# Patient Record
Sex: Male | Born: 1980 | Race: White | Hispanic: No | Marital: Single
Health system: Midwestern US, Community
[De-identification: ages and names within clinical notes are randomized; demographics above are authoritative.]

## PROBLEM LIST (undated history)

## (undated) DIAGNOSIS — I10 Essential (primary) hypertension: Secondary | ICD-10-CM

## (undated) DIAGNOSIS — I639 Cerebral infarction, unspecified: Secondary | ICD-10-CM

## (undated) DIAGNOSIS — E7800 Pure hypercholesterolemia, unspecified: Secondary | ICD-10-CM

---

## 2018-04-23 ENCOUNTER — Emergency Department: Admit: 2018-04-23 | Payer: BLUE CROSS/BLUE SHIELD | Primary: Family Medicine

## 2018-04-23 ENCOUNTER — Inpatient Hospital Stay
Admit: 2018-04-23 | Discharge: 2018-04-23 | Disposition: A | Discharge status: Home / Self Care | Payer: BLUE CROSS/BLUE SHIELD | Attending: Emergency Medicine

## 2018-04-23 DIAGNOSIS — J069 Acute upper respiratory infection, unspecified: Secondary | ICD-10-CM

## 2018-04-23 LAB — RAPID INFLUENZA A/B ANTIGENS
Influenza A Ag: NEGATIVE
Influenza B Ag: NEGATIVE

## 2018-04-23 LAB — INFLUENZA A & B AG (RAPID TEST)
Influenza A Ag: NEGATIVE
Influenza B Ag: NEGATIVE

## 2018-04-23 MED ORDER — DOXYCYCLINE HYCLATE 100 MG TAB
100 mg | ORAL_TABLET | Freq: Two times a day (BID) | ORAL | 0 refills | Status: AC
Start: 2018-04-23 — End: 2018-05-03

## 2018-04-23 MED ORDER — FLUTICASONE 50 MCG/ACTUATION NASAL SPRAY, SUSP
50 mcg/actuation | Freq: Every day | NASAL | 0 refills | Status: AC
Start: 2018-04-23 — End: ?

## 2018-04-23 NOTE — ED Provider Notes (Signed)
Here with a cough for about 1/2 weeks.  He did no point has been productive.  He is unaware of any fever other than he felt as though he might of had a low-grade temperature this morning.  No history of underlying pulmonary/respiratory issues but he does have a history of significant hypertension and having had a stroke within the last year.  Stroke left him with some moderate to mild left-sided deficit.  He is also in a sober living environment that he has been placed here from the facility he was in [place].  He states he missed his work schedule today needs a note    The history is provided by the patient.   Cough   Pertinent negatives include no chills.        History reviewed. No pertinent past medical history.    History reviewed. No pertinent surgical history.      History reviewed. No pertinent family history.    Social History     Socioeconomic History   ??? Marital status: SINGLE     Spouse name: Not on file   ??? Number of children: Not on file   ??? Years of education: Not on file   ??? Highest education level: Not on file   Occupational History   ??? Not on file   Social Needs   ??? Financial resource strain: Not on file   ??? Food insecurity:     Worry: Not on file     Inability: Not on file   ??? Transportation needs:     Medical: Not on file     Non-medical: Not on file   Tobacco Use   ??? Smoking status: Never Smoker   ??? Smokeless tobacco: Never Used   Substance and Sexual Activity   ??? Alcohol use: Never     Frequency: Never   ??? Drug use: Never   ??? Sexual activity: Not on file   Lifestyle   ??? Physical activity:     Days per week: Not on file     Minutes per session: Not on file   ??? Stress: Not on file   Relationships   ??? Social connections:     Talks on phone: Not on file     Gets together: Not on file     Attends religious service: Not on file     Active member of club or organization: Not on file     Attends meetings of clubs or organizations: Not on file     Relationship status: Not on file   ??? Intimate  partner violence:     Fear of current or ex partner: Not on file     Emotionally abused: Not on file     Physically abused: Not on file     Forced sexual activity: Not on file   Other Topics Concern   ??? Not on file   Social History Narrative   ??? Not on file         ALLERGIES: Patient has no known allergies.    Review of Systems   Constitutional: Negative for chills and diaphoresis.   Respiratory: Positive for cough. Negative for stridor.    Cardiovascular: Negative.    Gastrointestinal: Negative.    All other systems reviewed and are negative.      Vitals:    04/23/18 1425   BP: 125/74   Pulse: 98   Resp: 16   Temp: 98 ??F (36.7 ??C)   SpO2: 99%  Weight: 61.7 kg (136 lb)   Height: 5\' 4"  (1.626 m)            Physical Exam   Constitutional: He appears well-developed and well-nourished. No distress.   HENT:   Head: Atraumatic.   Right Ear: External ear normal.   Left Ear: External ear normal.   Nose: Nose normal.   Mouth/Throat: Oropharynx is clear and moist. No oropharyngeal exudate.   Benign exam   Eyes: No scleral icterus.   Neck: Neck supple.   Cardiovascular: Normal rate, regular rhythm and intact distal pulses.   Pulmonary/Chest: Effort normal. No stridor. No respiratory distress. He has no wheezes.   Abdominal: Soft.   Musculoskeletal: He exhibits deformity. He exhibits no edema.   Lymphadenopathy:     He has no cervical adenopathy.   Neurological: He is alert.   Skin: Skin is warm and dry.   Psychiatric: Thought content normal.   Nursing note and vitals reviewed.       MDM  Number of Diagnoses or Management Options  Cough:   Upper respiratory tract infection, unspecified type:   Diagnosis management comments: Glenford Peers with cough at present seems viral. Will give Rx he can begin if fever and significant infected sputum. No substance rehab program       Amount and/or Complexity of Data Reviewed  Clinical lab tests: reviewed  Tests in the radiology section of CPT??: reviewed  Independent visualization of images,  tracings, or specimens: yes    Risk of Complications, Morbidity, and/or Mortality  Presenting problems: moderate  Diagnostic procedures: low  Management options: moderate    Patient Progress  Patient progress: stable         Procedures

## 2018-04-23 NOTE — ED Notes (Signed)
Patient co chest congestion and sinus pressure for about one week.  Patient denies taking any otc meds to help with symptoms.  Patient states cough is dry

## 2018-04-23 NOTE — ED Notes (Signed)
 I have reviewed discharge instructions with the patient.  The patient verbalized understanding.    Patient left ED via Discharge Method: ambulatory to Home.    Opportunity for questions and clarification provided.       Patient given 2 scripts.         To continue your aftercare when you leave the hospital, you may receive an automated call from our care team to check in on how you are doing.  This is a free service and part of our promise to provide the best care and service to meet your aftercare needs." If you have questions, or wish to unsubscribe from this service please call [phone].  Thank you for Choosing our [facility] Emergency Department.

## 2018-04-23 NOTE — ED Provider Notes (Signed)
Here with a cough for about 1/2 weeks.  He did no point has been productive.  He is unaware of any fever other than he felt as though he might of had a low-grade temperature this morning.  No history of underlying pulmonary/respiratory issues but he does have a history of significant hypertension and having had a stroke within the last year.  Stroke left him with some moderate to mild left-sided deficit.  He is also in a sober living environment that he has been placed here from the facility he was in [place].  He states he missed his work schedule today needs a note    The history is provided by the patient.   Cough   Pertinent negatives include no chills.        History reviewed. No pertinent past medical history.    History reviewed. No pertinent surgical history.      History reviewed. No pertinent family history.    Social History     Socioeconomic History   ??? Marital status: SINGLE     Spouse name: Not on file   ??? Number of children: Not on file   ??? Years of education: Not on file   ??? Highest education level: Not on file   Occupational History   ??? Not on file   Social Needs   ??? Financial resource strain: Not on file   ??? Food insecurity:     Worry: Not on file     Inability: Not on file   ??? Transportation needs:     Medical: Not on file     Non-medical: Not on file   Tobacco Use   ??? Smoking status: Never Smoker   ??? Smokeless tobacco: Never Used   Substance and Sexual Activity   ??? Alcohol use: Never     Frequency: Never   ??? Drug use: Never   ??? Sexual activity: Not on file   Lifestyle   ??? Physical activity:     Days per week: Not on file     Minutes per session: Not on file   ??? Stress: Not on file   Relationships   ??? Social connections:     Talks on phone: Not on file     Gets together: Not on file     Attends religious service: Not on file     Active member of club or organization: Not on file     Attends meetings of clubs or organizations: Not on file     Relationship status: Not on file    ??? Intimate partner violence:     Fear of current or ex partner: Not on file     Emotionally abused: Not on file     Physically abused: Not on file     Forced sexual activity: Not on file   Other Topics Concern   ??? Not on file   Social History Narrative   ??? Not on file         ALLERGIES: Patient has no known allergies.    Review of Systems   Constitutional: Negative for chills and diaphoresis.   Respiratory: Positive for cough. Negative for stridor.    Cardiovascular: Negative.    Gastrointestinal: Negative.    All other systems reviewed and are negative.      Vitals:    04/23/18 1425   BP: 125/74   Pulse: 98   Resp: 16   Temp: 98 ??F (36.7 ??C)   SpO2: 99%  Weight: 61.7 kg (136 lb)   Height: 5\' 4"  (1.626 m)            Physical Exam   Constitutional: He appears well-developed and well-nourished. No distress.   HENT:   Head: Atraumatic.   Right Ear: External ear normal.   Left Ear: External ear normal.   Nose: Nose normal.   Mouth/Throat: Oropharynx is clear and moist. No oropharyngeal exudate.   Benign exam   Eyes: No scleral icterus.   Neck: Neck supple.   Cardiovascular: Normal rate, regular rhythm and intact distal pulses.   Pulmonary/Chest: Effort normal. No stridor. No respiratory distress. He has no wheezes.   Abdominal: Soft.   Musculoskeletal: He exhibits deformity. He exhibits no edema.   Lymphadenopathy:     He has no cervical adenopathy.   Neurological: He is alert.   Skin: Skin is warm and dry.   Psychiatric: Thought content normal.   Nursing note and vitals reviewed.       MDM  Number of Diagnoses or Management Options  Cough:   Upper respiratory tract infection, unspecified type:   Diagnosis management comments: Glenford Peers with cough at present seems viral. Will give Rx he can begin if fever and significant infected sputum. No substance rehab program       Amount and/or Complexity of Data Reviewed  Clinical lab tests: reviewed  Tests in the radiology section of CPT??: reviewed   Independent visualization of images, tracings, or specimens: yes    Risk of Complications, Morbidity, and/or Mortality  Presenting problems: moderate  Diagnostic procedures: low  Management options: moderate    Patient Progress  Patient progress: stable         Procedures

## 2018-04-23 NOTE — ED Triage Notes (Signed)
Patient co chest congestion and sinus pressure for about one week.  Patient denies taking any otc meds to help with symptoms.  Patient states cough is dry

## 2018-04-23 NOTE — ED Notes (Signed)
I have reviewed discharge instructions with the patient.  The patient verbalized understanding.    Patient left ED via Discharge Method: ambulatory to Home.    Opportunity for questions and clarification provided.       Patient given 2 scripts.         To continue your aftercare when you leave the hospital, you may receive an automated call from our care team to check in on how you are doing.  This is a free service and part of our promise to provide the best care and service to meet your aftercare needs.??? If you have questions, or wish to unsubscribe from this service please call [phone].  Thank you for Choosing our [place] Emergency Department.

## 2018-06-25 ENCOUNTER — Emergency Department (HOSPITAL_COMMUNITY): Payer: BLUE CROSS/BLUE SHIELD

## 2018-06-25 ENCOUNTER — Other Ambulatory Visit: Payer: Self-pay

## 2018-06-25 ENCOUNTER — Encounter (HOSPITAL_COMMUNITY): Payer: Self-pay | Admitting: Emergency Medicine

## 2018-06-25 ENCOUNTER — Emergency Department (HOSPITAL_COMMUNITY)
Admission: EM | Admit: 2018-06-25 | Discharge: 2018-06-25 | Disposition: A | Discharge status: Home / Self Care | Payer: BLUE CROSS/BLUE SHIELD | Attending: Emergency Medicine | Admitting: Emergency Medicine

## 2018-06-25 DIAGNOSIS — R2681 Unsteadiness on feet: Secondary | ICD-10-CM | POA: Diagnosis not present

## 2018-06-25 DIAGNOSIS — I10 Essential (primary) hypertension: Secondary | ICD-10-CM | POA: Insufficient documentation

## 2018-06-25 DIAGNOSIS — R200 Anesthesia of skin: Secondary | ICD-10-CM | POA: Diagnosis not present

## 2018-06-25 DIAGNOSIS — R29810 Facial weakness: Secondary | ICD-10-CM | POA: Diagnosis not present

## 2018-06-25 DIAGNOSIS — Z8673 Personal history of transient ischemic attack (TIA), and cerebral infarction without residual deficits: Secondary | ICD-10-CM | POA: Diagnosis not present

## 2018-06-25 DIAGNOSIS — R531 Weakness: Secondary | ICD-10-CM | POA: Diagnosis not present

## 2018-06-25 DIAGNOSIS — Z7901 Long term (current) use of anticoagulants: Secondary | ICD-10-CM | POA: Insufficient documentation

## 2018-06-25 DIAGNOSIS — Z79899 Other long term (current) drug therapy: Secondary | ICD-10-CM | POA: Insufficient documentation

## 2018-06-25 HISTORY — DX: Pure hypercholesterolemia, unspecified: E78.00

## 2018-06-25 HISTORY — DX: Essential (primary) hypertension: I10

## 2018-06-25 HISTORY — DX: Cerebral infarction, unspecified: I63.9

## 2018-06-25 LAB — COMPREHENSIVE METABOLIC PANEL WITH GFR
ALT: 26 U/L (ref 0–44)
AST: 22 U/L (ref 15–41)
Albumin: 3.8 g/dL (ref 3.5–5.0)
Alkaline Phosphatase: 60 U/L (ref 38–126)
Anion gap: 11 (ref 5–15)
BUN: 12 mg/dL (ref 6–20)
CO2: 23 mmol/L (ref 22–32)
Calcium: 9.5 mg/dL (ref 8.9–10.3)
Chloride: 105 mmol/L (ref 98–111)
Creatinine, Ser: 1.05 mg/dL (ref 0.61–1.24)
GFR calc Af Amer: >60 mL/min
GFR calc non Af Amer: >60 mL/min
Glucose, Bld: 106 mg/dL — ABNORMAL HIGH (ref 70–99)
Potassium: 3.9 mmol/L (ref 3.5–5.1)
Sodium: 139 mmol/L (ref 135–145)
Total Bilirubin: 0.7 mg/dL (ref 0.3–1.2)
Total Protein: 6.4 g/dL — ABNORMAL LOW (ref 6.5–8.1)

## 2018-06-25 LAB — DIFFERENTIAL
Abs Immature Granulocytes: 0.01 10*3/uL (ref 0.00–0.07)
Basophils Absolute: 0 10*3/uL (ref 0.0–0.1)
Basophils Relative: 0 %
Eosinophils Absolute: 0.1 10*3/uL (ref 0.0–0.5)
Eosinophils Relative: 2 %
Immature Granulocytes: 0 %
LYMPHS PCT: 23 %
Lymphs Abs: 1.7 10*3/uL (ref 0.7–4.0)
Monocytes Absolute: 0.6 10*3/uL (ref 0.1–1.0)
Monocytes Relative: 8 %
NEUTROS ABS: 4.9 10*3/uL (ref 1.7–7.7)
Neutrophils Relative %: 67 %

## 2018-06-25 LAB — PROTIME-INR
INR: 0.98
Prothrombin Time: 12.9 seconds (ref 11.4–15.2)

## 2018-06-25 LAB — CBC
HEMATOCRIT: 42.4 % (ref 39.0–52.0)
Hemoglobin: 13.6 g/dL (ref 13.0–17.0)
MCH: 28.1 pg (ref 26.0–34.0)
MCHC: 32.1 g/dL (ref 30.0–36.0)
MCV: 87.6 fL (ref 80.0–100.0)
Platelets: 292 10*3/uL (ref 150–400)
RBC: 4.84 MIL/uL (ref 4.22–5.81)
RDW: 14.1 % (ref 11.5–15.5)
WBC: 7.3 10*3/uL (ref 4.0–10.5)
nRBC: 0 % (ref 0.0–0.2)

## 2018-06-25 LAB — RAPID URINE DRUG SCREEN, HOSP PERFORMED
AMPHETAMINES: NOT DETECTED
BENZODIAZEPINES: NOT DETECTED
Barbiturates: NOT DETECTED
Cocaine: NOT DETECTED
Opiates: NOT DETECTED
Tetrahydrocannabinol: NOT DETECTED

## 2018-06-25 LAB — I-STAT CHEM 8, ED
BUN: 14 mg/dL (ref 6–20)
Calcium, Ion: 1.07 mmol/L — ABNORMAL LOW (ref 1.15–1.40)
Chloride: 105 mmol/L (ref 98–111)
Creatinine, Ser: 1 mg/dL (ref 0.61–1.24)
Glucose, Bld: 102 mg/dL — ABNORMAL HIGH (ref 70–99)
HCT: 42 % (ref 39.0–52.0)
Hemoglobin: 14.3 g/dL (ref 13.0–17.0)
Potassium: 3.9 mmol/L (ref 3.5–5.1)
Sodium: 139 mmol/L (ref 135–145)
TCO2: 26 mmol/L (ref 22–32)

## 2018-06-25 LAB — APTT: aPTT: 31 seconds (ref 24–36)

## 2018-06-25 LAB — I-STAT TROPONIN, ED: Troponin i, poc: 0 ng/mL (ref 0.00–0.08)

## 2018-06-25 LAB — CBG MONITORING, ED: Glucose-Capillary: 97 mg/dL (ref 70–99)

## 2018-06-25 MED ORDER — LORAZEPAM 1 MG PO TABS
1.0000 mg | ORAL_TABLET | Freq: Once | ORAL | Status: AC
  Administered 2018-06-25: 1 mg via ORAL
  Filled 2018-06-25: qty 1

## 2018-06-25 MED ORDER — IOPAMIDOL (ISOVUE-370) INJECTION 76%
100.0000 mL | Freq: Once | INTRAVENOUS | Status: AC | PRN
  Administered 2018-06-25: 100 mL via INTRAVENOUS

## 2018-06-25 NOTE — Consult Note (Addendum)
Neurology Consultation  Reason for Consult: Code stroke Referring Physician: Fredderick PhenixBelfi  CC: Code stroke  History is obtained from: Patient  HPI: William Morgan is a 37 y.o. male history of possible TIA, anxiety and hypertension.  Patient was with his friends at a apartment complex today when apparently he has sudden facial droop on the left to which reminded me of his previous possible TIA.  He became very anxious and EMS was called.  When EMS arrived his systolic blood pressure was in the 200s but however decreased as they got closer to the hospital.  Arriving at the hospital he was very anxious but had no localizing or lateralizing symptoms.  Heart rate was in the low 100s, blood pressure was 170/74 and CBG was 114.  Patient was able to follow commands and answer questions.  [place] not from [place] but visiting at this time.  CT of head and CTA of head and neck were obtained showing no abnormalities.   LKW: 1400 hrs. on 06/25/2018 tpa given?: no, NIH stroke score of 0 Premorbid modified Rankin scale (mRS): 0  ROS: A 14 point ROS was performed and is negative except as noted in the HPI.  Past Medical History:  Diagnosis Date  . Elevated cholesterol   . Hypertension   . Stroke [facility](HCC)     Family History  Problem Relation Age of Onset  . Hypertension Mother   . Hypertension Father      Social History:   reports that he has never smoked. He has never used smokeless tobacco. He reports previous alcohol use. He reports that he does not use drugs.  Medications No current facility-administered medications for this encounter.  No current outpatient medications on file.   Exam: Current vital signs: BP (!) 134/103 (BP Location: Right Arm)   Pulse (!) 108   Temp 99.1 F (37.3 C) (Oral)   Resp (!) 21   Ht 5\' 4"  (1.626 m)   Wt 64.7 kg   SpO2 99%   BMI 24.48 kg/m  Vital signs in last 24 hours: Temp:  [99.1 F (37.3 C)] 99.1 F (37.3 C) (12/23 1709) Pulse Rate:   [108] 108 (12/23 1709) Resp:  [21] 21 (12/23 1709) BP: (134)/(103) 134/103 (12/23 1709) SpO2:  [98 %-99 %] 99 % (12/23 1709) Weight:  [64.7 kg] 64.7 kg (12/23 1710)  Physical Exam  Constitutional: Appears well-developed and well-nourished.  Psych: Affect appropriate to situation Eyes: No scleral injection HENT: No OP obstrucion Head: Normocephalic.  Cardiovascular: Normal rate and regular rhythm.  Respiratory: Effort normal, non-labored breathing GI: Soft.  No distension. There is no tenderness.  Skin: WDI  Neuro: Mental Status: Patient is awake, alert, oriented to person, place, month, year, and situation. Patient is able to give a clear and coherent history. No signs of aphasia or neglect Cranial Nerves: II: Visual Fields are full. Pupils are equal, round, and reactive to light.   III,IV, VI: EOMI without ptosis or diploplia.  V: Facial sensation is symmetric to temperature VII: Facial movement is symmetric.  VIII: hearing is intact to voice X: Uvula elevates symmetrically XI: Shoulder shrug is symmetric. XII: tongue is midline without atrophy or fasciculations.  Motor: Tone is normal. Bulk is normal. 5/5 strength was present in all four extremities.  Sensory: Sensation is symmetric to light touch and temperature in the arms and legs. Deep Tendon Reflexes: 2+ and symmetric in the biceps and patellae.  Plantars: Toes are downgoing bilaterally.  Cerebellar: FNF and HKS are intact  bilaterally  Labs I have reviewed labs in epic and the results pertinent to this consultation are:   CBC    Component Value Date/Time   WBC 7.3 06/25/2018 1642   RBC 4.84 06/25/2018 1642   HGB 14.3 06/25/2018 1647   HCT 42.0 06/25/2018 1647   PLT 292 06/25/2018 1642   MCV 87.6 06/25/2018 1642   MCH 28.1 06/25/2018 1642   MCHC 32.1 06/25/2018 1642   RDW 14.1 06/25/2018 1642   LYMPHSABS 1.7 06/25/2018 1642   MONOABS 0.6 06/25/2018 1642   EOSABS 0.1 06/25/2018 1642   BASOSABS 0.0  06/25/2018 1642    CMP     Component Value Date/Time   NA 139 06/25/2018 1647   K 3.9 06/25/2018 1647   CL 105 06/25/2018 1647   CO2 23 06/25/2018 1642   GLUCOSE 102 (H) 06/25/2018 1647   BUN 14 06/25/2018 1647   CREATININE 1.00 06/25/2018 1647   CALCIUM 9.5 06/25/2018 1642   PROT 6.4 (L) 06/25/2018 1642   ALBUMIN 3.8 06/25/2018 1642   AST 22 06/25/2018 1642   ALT 26 06/25/2018 1642   ALKPHOS 60 06/25/2018 1642   BILITOT 0.7 06/25/2018 1642   GFRNONAA >60 06/25/2018 1642   GFRAA >60 06/25/2018 1642    Lipid Panel  No results found for: CHOL, TRIG, HDL, CHOLHDL, VLDL, LDLCALC, LDLDIRECT   Imaging I have reviewed the images obtained:  CT-scan of the brain- normal noncontrast CT appearance of the brain  CTA of head neck- showed no large vessel occlusion or aneurysm.  MRI examination of the brain  Felicie MornDavid Smith PA-C [facility] [phone]  M-F  (9:00 am- 5:00 PM)  06/25/2018, 5:19 PM     Assessment:  This is a 37 year old male presenting to the hospital as a code stroke.  Patient has no localizing lateralizing abnormalities.  At this time I truly believe that patient is having an anxiety attack however we will obtain a MRI to fully rule out any form of stroke.  We will also obtain UDS to evaluate for possibility of illicit drug use.   Recommendations: -UDS -MRI brain - If these are negative no further work-up and patient may go home    NEUROHOSPITALIST ADDENDUM Performed a face to face diagnostic evaluation.   I have reviewed the contents of history and physical exam as documented by PA/ARNP/Resident and agree with above documentation.  I have discussed and formulated the above plan as documented. Edits to the note have been made as needed.  Patient presents with sudden onset left-sided weakness and left facial droop.  Facial droop disappears as patient speaks, asked to smile.. Symptoms were too mild to administer TPA.  Low suspicion for stroke,  suspect conversion disorder versus complicated migraine. Recommend MRI brain.   MRI brain shows no acute strok-  patient does have a old lacunar infarct from his history of hypertension.  Patient should continue antiplatelet therapy as well as his blood pressure medications.   Can follow-up with neurology as an outpatient   Sushanth Aroor MD [facility] [phone]   If 7pm to 7am, please call on call as listed on AMION.

## 2018-06-25 NOTE — ED Notes (Signed)
Patient transported to MRI 

## 2018-06-25 NOTE — Code Documentation (Signed)
37[facility] at [facility]38. EMS reports sudden onset left facial droop and left sided weakness at 1600. EMS reports patient initially hypertensive with SBP of 200. Of note, patient recently moved here from [place]. Patient reports h/o stroke one year ago with mild left sided residual deficits. Patient reports taking an antihypertensive as well as ASA and Plavix. Stroke team at the bedside on patient arrival. Labs drawn and patient cleared for CT by Dr. Fredderick PhenixBelfi. Patient to CT with team. CT followed by CTA completed. NIHSS 2, see documentation for details and code stroke times. Patient with slight left arm drift and subjective decreased sensation to left face, arm and leg on exam. Dr. Laurence SlateAroor at the bedside. No acute stroke treatment at this time. Patient remains in the tPA window until 2030 should symptoms worsen. Bedside handoff with ED RN Merry ProudBrandi.

## 2018-06-25 NOTE — ED Triage Notes (Signed)
Patient presents to the ED by EMS with c/o stroke like symptoms. Pt with LKW 16:30 reports hanging at with buddies when they noticed a "change" pt reports hx of CVA x1 year ago with left side residual. Left sided facial drop and weakness in the left leg.  Vitals   200 /100 170/74 98 pulse cbg 114

## 2018-06-25 NOTE — ED Provider Notes (Signed)
[facility] EMERGENCY DEPARTMENT Provider Note   CSN: [id number] Arrival date & time: 06/25/18  1641   An emergency department physician performed an initial assessment on this suspected stroke patient at 1640(Jacyln Carmer).  History   Chief Complaint Chief Complaint  Patient presents with  . Code Stroke    HPI Deno Sida is a 37 y.o. male.  Patient is a 37 year old male who presents as a code stroke.  He states around 430 this noon he noted some drooping on the left side of his face with weakness in his left side.  This includes left arm and left leg.  He said he was having a hard time walking.  He denies any vision changes.  No recent head trauma.  No speech deficits.  He reports that about a year ago he had what was felt to be a stroke although he had a normal CT scan and normal MRI.  He has a history of hypertension and hyperlipidemia.     Past Medical History:  Diagnosis Date  . Elevated cholesterol   . Hypertension   . Stroke [facility])     There are no active problems to display for this patient.   History reviewed. No pertinent surgical history.      Home Medications    Prior to Admission medications   Medication Sig Start Date End Date Taking? Authorizing Provider  amitriptyline (ELAVIL) 25 MG tablet Take 25 mg by mouth daily.   Yes [provider]  amLODipine (NORVASC) 10 MG tablet Take 10 mg by mouth daily.   Yes [provider]  aspirin EC 325 MG tablet Take 325 mg by mouth daily.   Yes [provider]  atorvastatin (LIPITOR) 20 MG tablet Take 20 mg by mouth at bedtime.   Yes [provider]  clopidogrel (PLAVIX) 75 MG tablet Take 75 mg by mouth at bedtime.   Yes [provider]  oxyCODONE-acetaminophen (PERCOCET) 10-325 MG tablet Take 1 tablet by mouth every 4 (four) hours as needed for pain.   Yes [provider]  traZODone (DESYREL) 100 MG tablet Take 100 mg by mouth at bedtime.    Yes [provider]    Family History Family History  Problem Relation Age of Onset  . Hypertension Mother   . Hypertension Father     Social History Social History   Tobacco Use  . Smoking status: Never Smoker  . Smokeless tobacco: Never Used  Substance Use Topics  . Alcohol use: Not Currently    Frequency: Never  . Drug use: Never     Allergies   Toradol [ketorolac tromethamine] and Tramadol   Review of Systems Review of Systems  Constitutional: Negative for chills, diaphoresis, fatigue and fever.  HENT: Negative for congestion, rhinorrhea and sneezing.   Eyes: Negative.   Respiratory: Negative for cough, chest tightness and shortness of breath.   Cardiovascular: Negative for chest pain and leg swelling.  Gastrointestinal: Negative for abdominal pain, blood in stool, diarrhea, nausea and vomiting.  Genitourinary: Negative for difficulty urinating, flank pain, frequency and hematuria.  Musculoskeletal: Negative for arthralgias and back pain.  Skin: Negative for rash.  Neurological: Positive for facial asymmetry, weakness and numbness. Negative for dizziness, speech difficulty and headaches.     Physical Exam Updated Vital Signs BP (!) 170/76   Pulse 91   Temp 99.1 F (37.3 C) (Oral)   Resp 19   Ht 5\' 4"  (1.626 m)   Wt 64.7 kg   SpO2  91%   BMI 24.48 kg/m   Physical Exam Constitutional:      Appearance: He is well-developed.  HENT:     Head: Normocephalic and atraumatic.  Eyes:     Pupils: Pupils are equal, round, and reactive to light.  Neck:     Musculoskeletal: Normal range of motion and neck supple.  Cardiovascular:     Rate and Rhythm: Normal rate and regular rhythm.     Heart sounds: Normal heart sounds.  Pulmonary:     Effort: Pulmonary effort is normal. No respiratory distress.     Breath sounds: Normal breath sounds. No wheezing or rales.  Chest:     Chest wall: No tenderness.  Abdominal:     General: Bowel sounds are normal.       Palpations: Abdomen is soft.     Tenderness: There is no abdominal tenderness. There is no guarding or rebound.  Musculoskeletal: Normal range of motion.  Lymphadenopathy:     Cervical: No cervical adenopathy.  Skin:    General: Skin is warm and dry.     Findings: No rash.  Neurological:     Mental Status: He is alert and oriented to person, place, and time.     Comments: Patient has some slight weakness to his left arm and left leg as compared to the right side.  No pronator drift.  Finger-nose intact, sensation intact light touch in all extremities.  He has some mild left side facial drooping.      ED Treatments / Results  Labs (all labs ordered are listed, but only abnormal results are displayed) Labs Reviewed  COMPREHENSIVE METABOLIC PANEL - Abnormal; Notable for the following components:      Result Value   Glucose, Bld 106 (*)    Total Protein 6.4 (*)    All other components within normal limits  I-STAT CHEM 8, ED - Abnormal; Notable for the following components:   Glucose, Bld 102 (*)    Calcium, Ion 1.07 (*)    All other components within normal limits  PROTIME-INR  APTT  CBC  DIFFERENTIAL  RAPID URINE DRUG SCREEN, HOSP PERFORMED  I-STAT TROPONIN, ED  CBG MONITORING, ED    EKG EKG Interpretation  Date/Time:  Monday June 25 2018 17:09:17 EST Ventricular Rate:  110 PR Interval:    QRS Duration: 86 QT Interval:  321 QTC Calculation: 435 R Axis:   38 Text Interpretation:  Sinus tachycardia Probable left atrial enlargement RSR' in V1 or V2, probably normal variant No old tracing to compare Confirmed by Rolan Bucco [id number]) on 06/25/2018 6:04:52 PM   Radiology Ct Angio Head W Or Wo Contrast  Result Date: 06/25/2018 CLINICAL DATA:  37 year old male code stroke presentation with left side symptoms. EXAM: CT ANGIOGRAPHY HEAD AND NECK TECHNIQUE: Multidetector CT imaging of the head and neck was performed using the standard protocol during bolus  administration of intravenous contrast. Multiplanar CT image reconstructions and MIPs were obtained to evaluate the vascular anatomy. Carotid stenosis measurements (when applicable) are obtained utilizing NASCET criteria, using the distal internal carotid diameter as the denominator. CONTRAST:  ISOVUE-370 IOPAMIDOL (ISOVUE-370) INJECTION 76% COMPARISON:  Noncontrast head CT 1648 hours today. FINDINGS: CTA NECK Skeleton: Prior mandible ORIF. Carious posterior left mandible molar. No acute osseous abnormality identified. Upper chest: Negative. Other neck: Negative. Aortic arch: 3 vessel arch configuration. Age advanced calcified arch and left CCA origin atherosclerosis. Right carotid system: Negative brachiocephalic artery and right CCA origin. Mild soft and calcified plaque  in the right CCA at the larynx without stenosis. Negative right ICA origin and cervical right ICA. Left carotid system: Left CCA origin plaque without stenosis. Intermittent soft and calcified plaque at the medial left CCA without stenosis to the bifurcation. Minimal plaque at the bifurcation. No cervical left ICA stenosis. Vertebral arteries: Proximal right subclavian artery and right vertebral artery origins are normal. The right vertebral is dominant and patent to the skull base without stenosis. Left subclavian artery origin calcified plaque without stenosis. Calcified plaque near the left vertebral artery origin without stenosis. Non dominant but fairly normal size left vertebral artery without stenosis to the skull base. CTA HEAD Posterior circulation: Mild distal vertebral artery irregularity without stenosis might reflect soft plaque. Both PICA origins are patent. Patent vertebrobasilar junction and basilar artery without stenosis. Normal SCA and PCA origins. Posterior communicating arteries are diminutive or absent. Bilateral PCA branches are mildly irregular but otherwise within normal limits. Anterior circulation: Both ICA siphons  are patent. Calcified distal left siphon plaque with only mild stenosis. No right siphon stenosis. Normal ophthalmic artery origins. Posterior communicating arteries are diminutive or absent. Patent carotid termini. Normal MCA and ACA origins. Bilateral ACA branches are mildly irregular. Left MCA M1 segment, bifurcation, and left MCA branches are patent with mild irregularity. Right MCA M1 segment, bifurcation, and right MCA branches are patent, mildly irregular. Venous sinuses: Patent. Anatomic variants: Dominant right vertebral artery. Review of the MIP images confirms the above findings IMPRESSION: 1. Negative for large vessel occlusion. 2. Age advanced atherosclerosis in the head and neck, but hemodynamically significant arterial stenosis identified. 3. These results were communicated to Dr. Laurence SlateAroor at 5:21 pm on 06/25/2018 by text page via the [facility] messaging system. Electronically Signed   By: Odessa FlemingH  Hall M.D.   On: 06/25/2018 17:22   Ct Angio Neck W Or Wo Contrast  Result Date: 06/25/2018 CLINICAL DATA:  37 year old male code stroke presentation with left side symptoms. EXAM: CT ANGIOGRAPHY HEAD AND NECK TECHNIQUE: Multidetector CT imaging of the head and neck was performed using the standard protocol during bolus administration of intravenous contrast. Multiplanar CT image reconstructions and MIPs were obtained to evaluate the vascular anatomy. Carotid stenosis measurements (when applicable) are obtained utilizing NASCET criteria, using the distal internal carotid diameter as the denominator. CONTRAST:  100mL ISOVUE-370 IOPAMIDOL (ISOVUE-370) INJECTION 76% COMPARISON:  Noncontrast head CT 1648 hours today. FINDINGS: CTA NECK Skeleton: Prior mandible ORIF. Carious posterior left mandible molar. No acute osseous abnormality identified. Upper chest: Negative. Other neck: Negative. Aortic arch: 3 vessel arch configuration. Age advanced calcified arch and left CCA origin atherosclerosis. Right carotid system:  Negative brachiocephalic artery and right CCA origin. Mild soft and calcified plaque in the right CCA at the larynx without stenosis. Negative right ICA origin and cervical right ICA. Left carotid system: Left CCA origin plaque without stenosis. Intermittent soft and calcified plaque at the medial left CCA without stenosis to the bifurcation. Minimal plaque at the bifurcation. No cervical left ICA stenosis. Vertebral arteries: Proximal right subclavian artery and right vertebral artery origins are normal. The right vertebral is dominant and patent to the skull base without stenosis. Left subclavian artery origin calcified plaque without stenosis. Calcified plaque near the left vertebral artery origin without stenosis. Non dominant but fairly normal size left vertebral artery without stenosis to the skull base. CTA HEAD Posterior circulation: Mild distal vertebral artery irregularity without stenosis might reflect soft plaque. Both PICA origins are patent. Patent vertebrobasilar junction and basilar artery without  stenosis. Normal SCA and PCA origins. Posterior communicating arteries are diminutive or absent. Bilateral PCA branches are mildly irregular but otherwise within normal limits. Anterior circulation: Both ICA siphons are patent. Calcified distal left siphon plaque with only mild stenosis. No right siphon stenosis. Normal ophthalmic artery origins. Posterior communicating arteries are diminutive or absent. Patent carotid termini. Normal MCA and ACA origins. Bilateral ACA branches are mildly irregular. Left MCA M1 segment, bifurcation, and left MCA branches are patent with mild irregularity. Right MCA M1 segment, bifurcation, and right MCA branches are patent, mildly irregular. Venous sinuses: Patent. Anatomic variants: Dominant right vertebral artery. Review of the MIP images confirms the above findings IMPRESSION: 1. Negative for large vessel occlusion. 2. Age advanced atherosclerosis in the head and neck,  but hemodynamically significant arterial stenosis identified. 3. These results were communicated to Dr. Laurence Slate at 5:21 pm on 06/25/2018 by text page via the [facility] messaging system. Electronically Signed   By: Odessa Fleming M.D.   On: 06/25/2018 17:22   Mr Brain Wo Contrast  Result Date: 06/25/2018 CLINICAL DATA:  37 year old male code stroke presentation with left-sided symptoms. Hypertensive on presentation. EXAM: MRI HEAD WITHOUT CONTRAST TECHNIQUE: Multiplanar, multiecho pulse sequences of the brain and surrounding structures were obtained without intravenous contrast. COMPARISON:  CT head and CTA head and neck earlier today. FINDINGS: Brain: No restricted diffusion or evidence of acute infarction. Chronic lacunar infarct of the right pons (series 10, image 9, series 6, image 18) with regional Wallerian degeneration. Scattered small right hemisphere cerebral white matter T2 and FLAIR hyperintense lesions which may also be the sequelae of chronic small vessel disease. That in the right centrum semiovale on series 7, image 20 most resembles a chronic lacune. No cortical encephalomalacia or chronic cerebral blood products identified. No midline shift, mass effect, evidence of mass lesion, ventriculomegaly, extra-axial collection or acute intracranial hemorrhage. Cervicomedullary junction and pituitary are within normal limits. Vascular: Major intracranial vascular flow voids are preserved. Skull and upper cervical spine: Negative visible cervical spine. Normal bone marrow signal. Sinuses/Orbits: Negative orbits. Frontal and ethmoid sinus mucosal thickening greater on the right. Other: Visible internal auditory structures appear normal. Mastoids are clear. Scalp and face soft tissues appear negative. IMPRESSION: 1.  No acute intracranial abnormality. 2. Chronic lacunar infarct in the right pons with mild regional Wallerian degeneration. Probable chronic small vessel disease in the right cerebral white matter.  Electronically Signed   By: Odessa Fleming M.D.   On: 06/25/2018 19:32   Ct Head Code Stroke Wo Contrast  Result Date: 06/25/2018 CLINICAL DATA:  Code stroke. 37 year old male with left side weakness and facial droop. Last seen normal 1400 hours. EXAM: CT HEAD WITHOUT CONTRAST TECHNIQUE: Contiguous axial images were obtained from the base of the skull through the vertex without intravenous contrast. COMPARISON:  None. FINDINGS: Brain: No midline shift, mass effect, or evidence of intracranial mass lesion. No acute intracranial hemorrhage identified. No ventriculomegaly. 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 No cortically based acute infarct identified. Gray-white matter differentiation is within normal limits throughout the brain. Vascular: There is some Calcified atherosclerosis at the skull base. No suspicious intracranial vascular hyperdensity. Skull: Negative. Sinuses/Orbits: Mild sinus mucosal thickening, mostly right ethmoids. Tympanic cavities and mastoids are clear. Other: Visualized orbit soft tissues are within normal limits. Visualized scalp soft tissues are within normal limits. ASPECTS [facility] Stroke Program Early CT Score) - Ganglionic level infarction (caudate, lentiform nuclei, internal capsule, insula, M1-M3 cortex): 7 -  Supraganglionic infarction (M4-M6 cortex): 3 Total score (0-10 with 10 being normal): 10 IMPRESSION: 1. Normal noncontrast CT appearance of the brain. 2. ASPECTS is 10. 3. These results were communicated to Dr. Laurence SlateAroor at 4:56 pmon 12/23/2019by text page via the [facility] messaging system. Electronically Signed   By: Odessa FlemingH  Hall M.D.   On: 06/25/2018 16:57    Procedures Procedures (including critical care time)  Medications Ordered in ED Medications  iopamidol (ISOVUE-370) 76 % injection 100 mL (100 mLs Intravenous Contrast Given 06/25/18 1703)  LORazepam (ATIVAN) tablet 1 mg (1 mg Oral Given 06/25/18 1830)     Initial Impression  / Assessment and Plan / ED Course  I have reviewed the triage vital signs and the nursing notes.  Pertinent labs & imaging results that were available during my care of the patient were reviewed by me and considered in my medical decision making (see chart for details).     Patient is a 37 year old male who had presented as a code stroke.  He had some left-sided weakness.  He had a CT scan and an MRI and a CTA which did not show any evidence of acute abnormality.  His labs are non-concerning.  His drug screen is negative.  He was evaluated by neurology who feels that patient can be discharged home and continue his current medications which includes Plavix and aspirin.  He states that he does have these prescriptions to take.  He needs a referral to a new neurologist here in town.  I [facility] give [facility].  He was given strict return precautions.  Final Clinical Impressions(s) / ED Diagnoses   Final diagnoses:  Left-sided weakness    ED Discharge Orders         Ordered    Ambulatory referral to Neurology    Comments:  An appointment is requested in approximately: 2 weeks   06/25/18 2152           Rolan BuccoBelfi, Roselynn Whitacre, MD 06/25/18 2155

## 2018-06-29 ENCOUNTER — Ambulatory Visit: Payer: BLUE CROSS/BLUE SHIELD | Admitting: Neurology

## 2018-06-29 ENCOUNTER — Encounter: Payer: Self-pay | Admitting: Neurology

## 2018-06-29 DIAGNOSIS — Z029 Encounter for administrative examinations, unspecified: Secondary | ICD-10-CM

## 2020-02-25 IMAGING — CT CT HEAD CODE STROKE
3 series · 15 of 47 positions shown, 18 images · non-contrast
Comparison: None.

CLINICAL DATA: Code stroke. 37-year-old male with left side
weakness and facial droop. Last seen normal 5866 hours.

EXAM:
CT HEAD WITHOUT CONTRAST
TECHNIQUE: Contiguous axial images were obtained from the base of the skull
through the vertex without intravenous contrast.

[Series 3: head 5.0 h30s · axial · 0.43mm/px · z∈[-134,+16]mm · 9 of 36 slices shown, 12 images]
[im 3/36  brain]
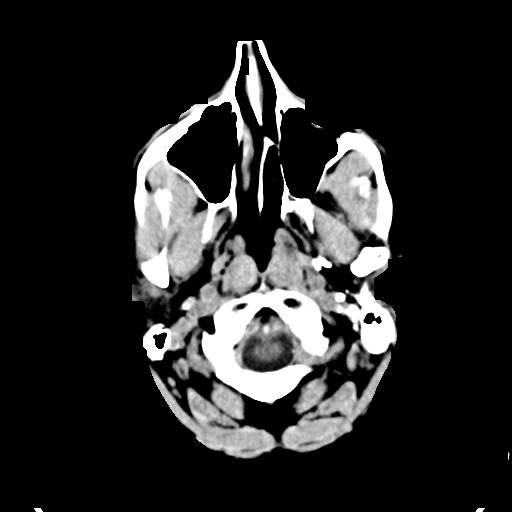
[im 3/36  bone]
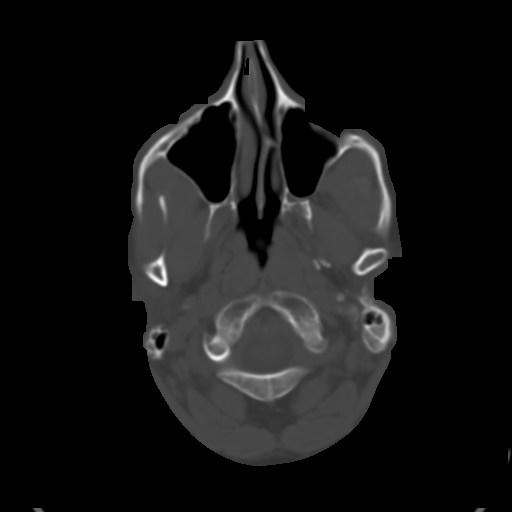
[im 7/36  brain]
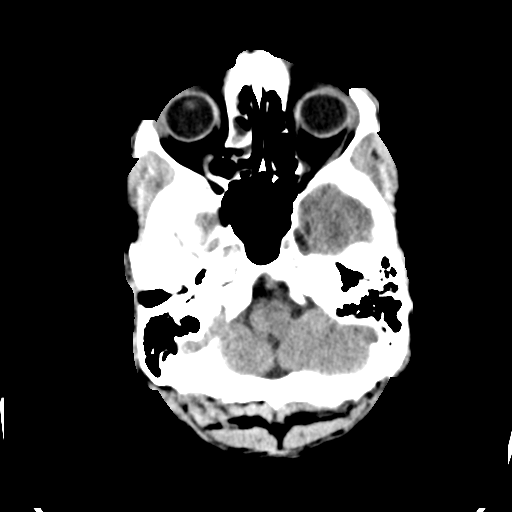
[im 10/36  brain]
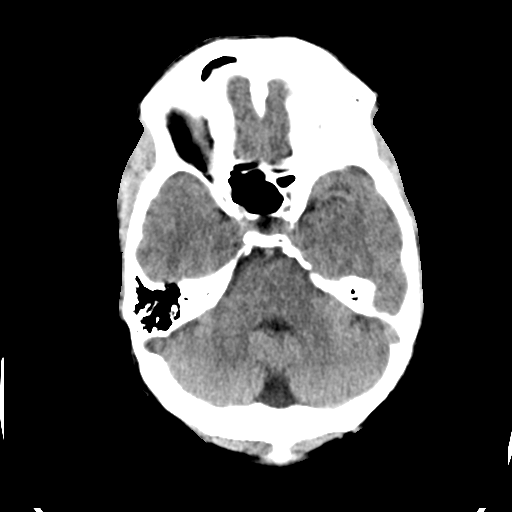
[im 14/36  brain]
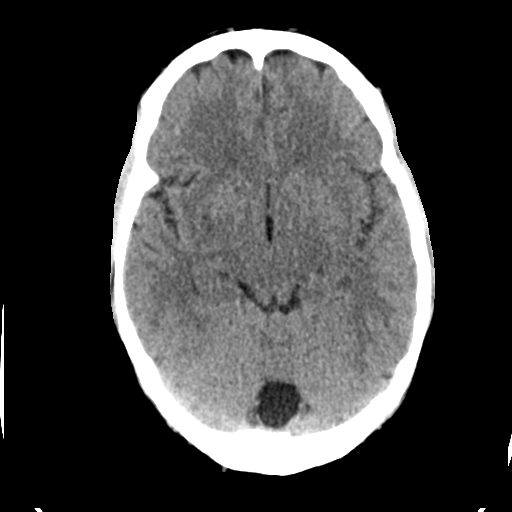
[im 19/36  brain]
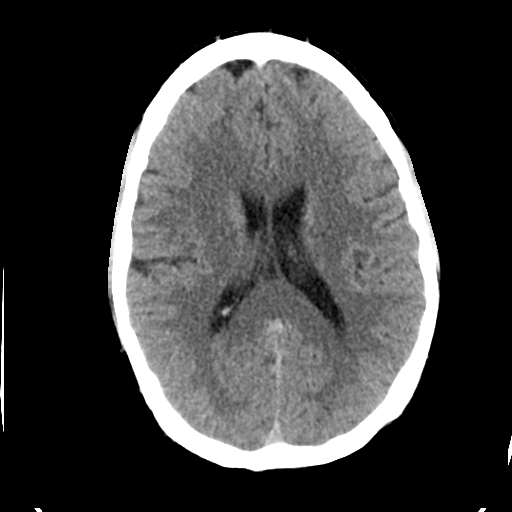
[im 19/36  bone]
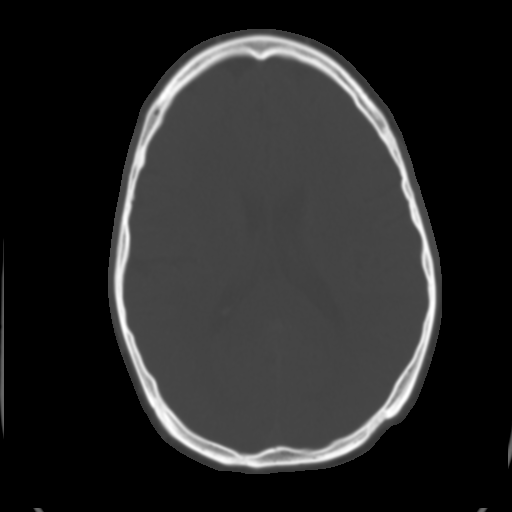
[im 22/36  brain]
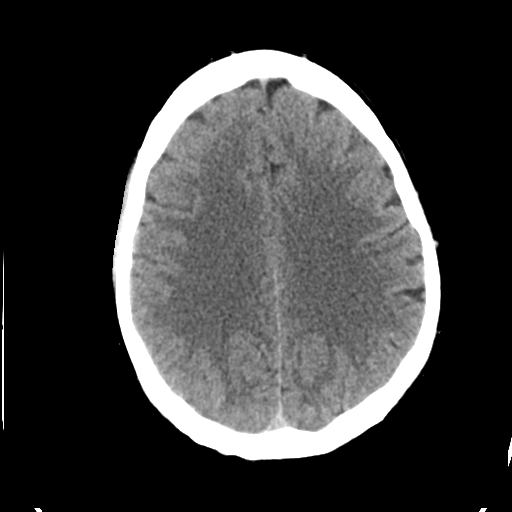
[im 26/36  brain]
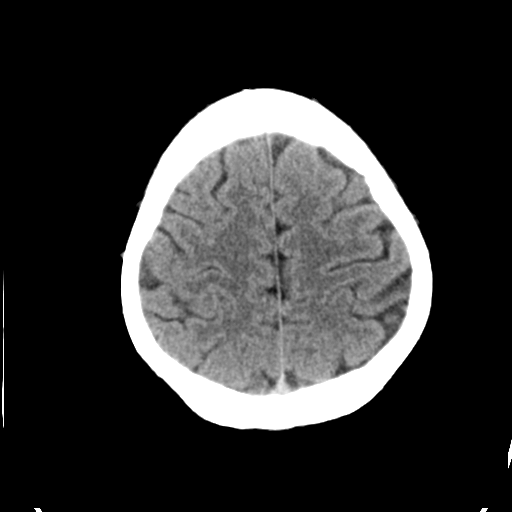
[im 29/36  brain]
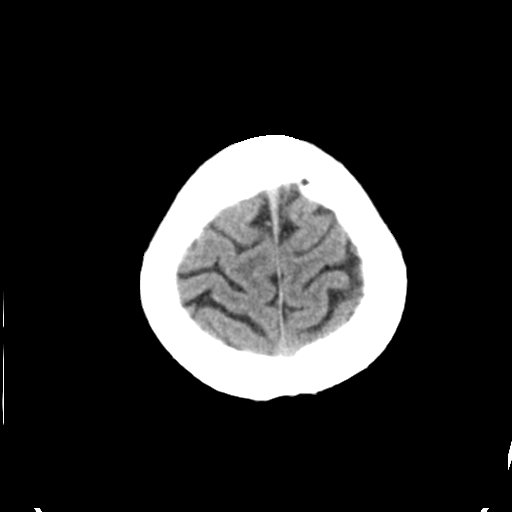
[im 33/36  brain]
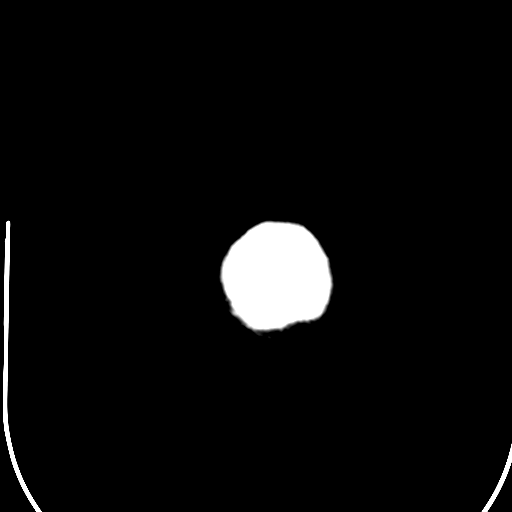
[im 33/36  bone]
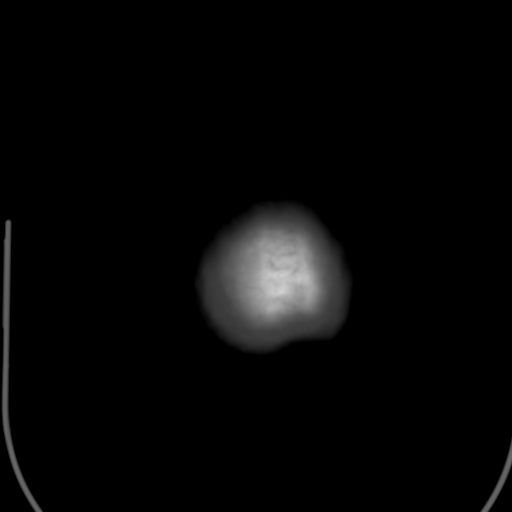

[Series 5: head 3.0 mpr cor · coronal · 0.35mm/px · 3 of 73 slices shown]
[im 25/73  brain]
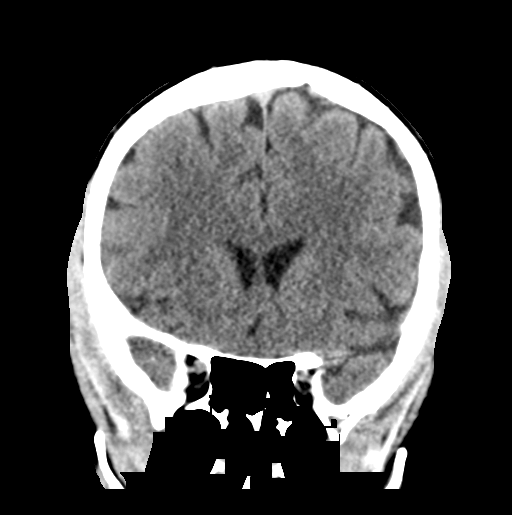
[im 33/73  brain]
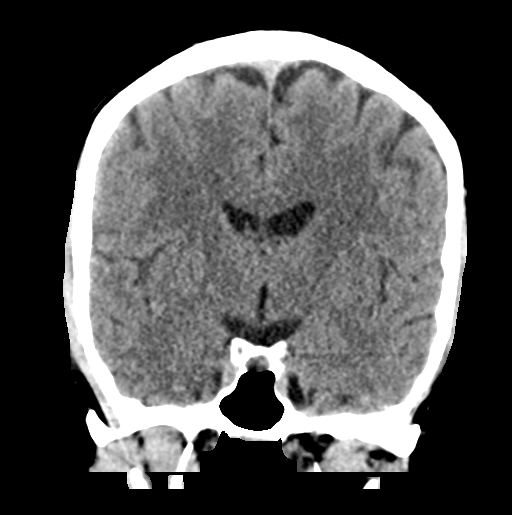
[im 41/73  brain]
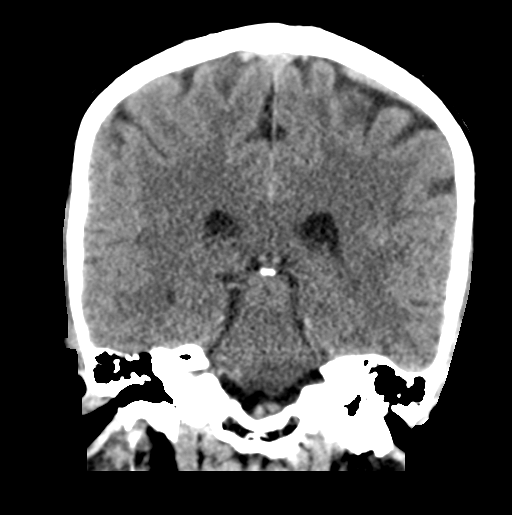

[Series 6: head 3.0 mpr sag · sagittal · 0.36mm/px · 3 of 59 slices shown]
[im 20/59  brain]
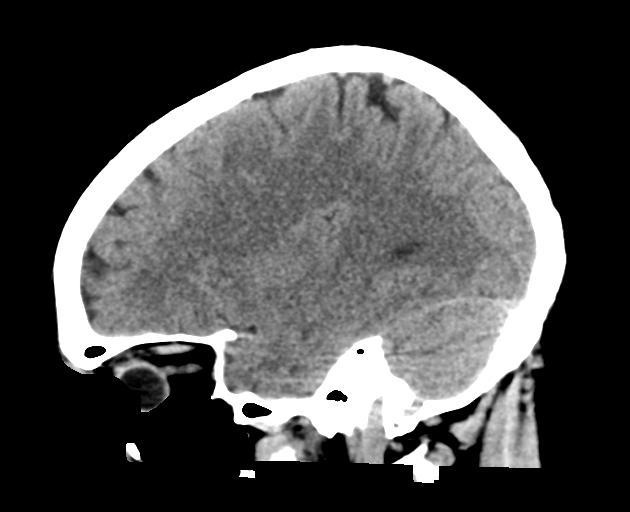
[im 30/59  brain]
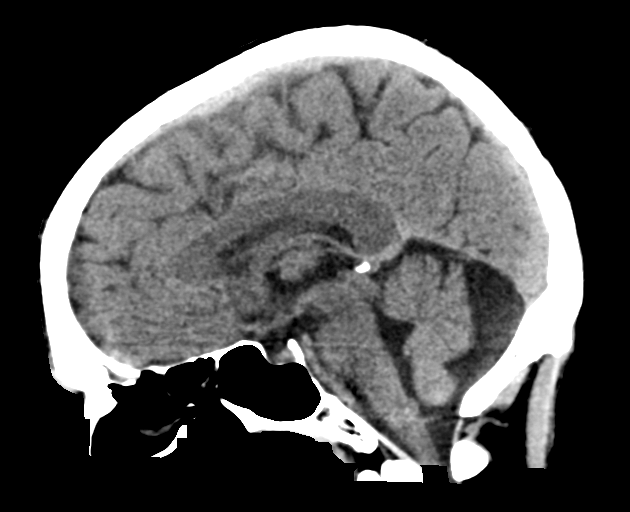
[im 39/59  brain]
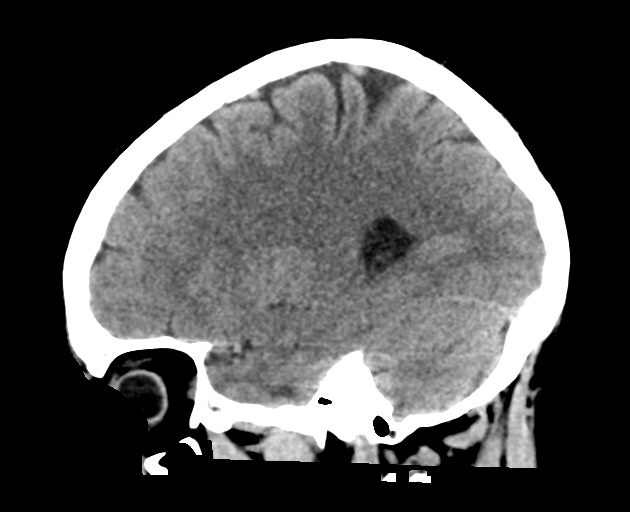

[15 of 47 positions shown; findings below may reference images not displayed]

FINDINGS: Brain: No midline shift, mass effect, or evidence of intracranial
mass lesion. No acute intracranial hemorrhage identified. No
ventriculomegaly. Mega cisterna magna versus small midline posterior
fossa arachnoid cyst, normal variant.

No cortically based acute infarct identified. Gray-white matter
differentiation is within normal limits throughout the brain.

Vascular: There is some Calcified atherosclerosis at the skull base.
No suspicious intracranial vascular hyperdensity.

Skull: Negative.

Sinuses/Orbits: Mild sinus mucosal thickening, mostly right
ethmoids. Tympanic cavities and mastoids are clear.

Other: Visualized orbit soft tissues are within normal limits.
Visualized scalp soft tissues are within normal limits.

ASPECTS ([place] Stroke Program Early CT Score)

- Ganglionic level infarction (caudate, lentiform nuclei, internal
capsule, insula, M1-M3 cortex): 7

- Supraganglionic infarction (M4-M6 cortex): 3

Total score (0-10 with 10 being normal): 10
IMPRESSION: 1. Normal noncontrast CT appearance of the brain.
2. ASPECTS is 10.
3. These results were communicated to Dr. Fong at [DATE] pmon
06/25/2018by text page via the [facility] messaging system.
